# Patient Record
Sex: Female | Born: 1995 | Race: White | Hispanic: No | Marital: Single | Smoking: Never smoker
Health system: Southern US, Community
[De-identification: ages and names within clinical notes are randomized; demographics above are authoritative.]

## PROBLEM LIST (undated history)

## (undated) HISTORY — PX: APPENDECTOMY: SHX54

---
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
## 2017-04-11 DIAGNOSIS — B349 Viral infection, unspecified: Secondary | ICD-10-CM | POA: Diagnosis not present

## 2017-04-11 DIAGNOSIS — J0100 Acute maxillary sinusitis, unspecified: Secondary | ICD-10-CM | POA: Diagnosis not present

## 2017-11-07 DIAGNOSIS — Z01419 Encounter for gynecological examination (general) (routine) without abnormal findings: Secondary | ICD-10-CM | POA: Diagnosis not present

## 2017-11-07 DIAGNOSIS — Z0000 Encounter for general adult medical examination without abnormal findings: Secondary | ICD-10-CM | POA: Diagnosis not present

## 2017-11-07 DIAGNOSIS — Z3009 Encounter for other general counseling and advice on contraception: Secondary | ICD-10-CM | POA: Diagnosis not present

## 2017-12-11 DIAGNOSIS — Z309 Encounter for contraceptive management, unspecified: Secondary | ICD-10-CM | POA: Diagnosis not present

## 2018-04-04 DIAGNOSIS — Z3041 Encounter for surveillance of contraceptive pills: Secondary | ICD-10-CM | POA: Diagnosis not present

## 2018-11-27 DIAGNOSIS — R51 Headache: Secondary | ICD-10-CM | POA: Diagnosis not present

## 2018-11-27 DIAGNOSIS — Z20828 Contact with and (suspected) exposure to other viral communicable diseases: Secondary | ICD-10-CM | POA: Diagnosis not present

## 2018-11-27 DIAGNOSIS — M791 Myalgia, unspecified site: Secondary | ICD-10-CM | POA: Diagnosis not present

## 2018-12-29 DIAGNOSIS — Z114 Encounter for screening for human immunodeficiency virus [HIV]: Secondary | ICD-10-CM | POA: Diagnosis not present

## 2018-12-29 DIAGNOSIS — Z0000 Encounter for general adult medical examination without abnormal findings: Secondary | ICD-10-CM | POA: Diagnosis not present

## 2018-12-29 DIAGNOSIS — Z130 Encounter for screening for diseases of the blood and blood-forming organs and certain disorders involving the immune mechanism: Secondary | ICD-10-CM | POA: Diagnosis not present

## 2018-12-29 DIAGNOSIS — Z13220 Encounter for screening for lipoid disorders: Secondary | ICD-10-CM | POA: Diagnosis not present

## 2019-01-01 DIAGNOSIS — Z0000 Encounter for general adult medical examination without abnormal findings: Secondary | ICD-10-CM | POA: Diagnosis not present

## 2019-01-23 DIAGNOSIS — F411 Generalized anxiety disorder: Secondary | ICD-10-CM | POA: Diagnosis not present

## 2019-01-26 DIAGNOSIS — Z20828 Contact with and (suspected) exposure to other viral communicable diseases: Secondary | ICD-10-CM | POA: Diagnosis not present

## 2019-01-28 DIAGNOSIS — Z20828 Contact with and (suspected) exposure to other viral communicable diseases: Secondary | ICD-10-CM | POA: Diagnosis not present

## 2019-02-07 DIAGNOSIS — Z62810 Personal history of physical and sexual abuse in childhood: Secondary | ICD-10-CM | POA: Diagnosis not present

## 2019-02-07 DIAGNOSIS — F439 Reaction to severe stress, unspecified: Secondary | ICD-10-CM | POA: Diagnosis not present

## 2019-02-07 DIAGNOSIS — Z630 Problems in relationship with spouse or partner: Secondary | ICD-10-CM | POA: Diagnosis not present

## 2019-02-28 DIAGNOSIS — Z62810 Personal history of physical and sexual abuse in childhood: Secondary | ICD-10-CM | POA: Diagnosis not present

## 2019-02-28 DIAGNOSIS — Z630 Problems in relationship with spouse or partner: Secondary | ICD-10-CM | POA: Diagnosis not present

## 2019-02-28 DIAGNOSIS — F439 Reaction to severe stress, unspecified: Secondary | ICD-10-CM | POA: Diagnosis not present

## 2019-03-12 DIAGNOSIS — Z20828 Contact with and (suspected) exposure to other viral communicable diseases: Secondary | ICD-10-CM | POA: Diagnosis not present

## 2019-04-21 DIAGNOSIS — Z20828 Contact with and (suspected) exposure to other viral communicable diseases: Secondary | ICD-10-CM | POA: Diagnosis not present

## 2019-04-21 DIAGNOSIS — J069 Acute upper respiratory infection, unspecified: Secondary | ICD-10-CM | POA: Diagnosis not present
